# Patient Record
Sex: Female | Born: 1995 | Race: Black or African American | Hispanic: No | Marital: Single | Smoking: Never smoker
Health system: Southern US, Community
[De-identification: ages and names within clinical notes are randomized; demographics above are authoritative.]

## PROBLEM LIST (undated history)

## (undated) DIAGNOSIS — L709 Acne, unspecified: Secondary | ICD-10-CM

## (undated) HISTORY — DX: Acne, unspecified: L70.9

---
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
## 2013-04-03 ENCOUNTER — Other Ambulatory Visit: Payer: Self-pay | Admitting: Gynecology

## 2013-04-23 ENCOUNTER — Ambulatory Visit (INDEPENDENT_AMBULATORY_CARE_PROVIDER_SITE_OTHER): Payer: PRIVATE HEALTH INSURANCE | Admitting: Gynecology

## 2013-04-23 ENCOUNTER — Encounter: Payer: Self-pay | Admitting: Gynecology

## 2013-04-23 DIAGNOSIS — R3915 Urgency of urination: Secondary | ICD-10-CM

## 2013-04-23 DIAGNOSIS — B9689 Other specified bacterial agents as the cause of diseases classified elsewhere: Secondary | ICD-10-CM

## 2013-04-23 DIAGNOSIS — A499 Bacterial infection, unspecified: Secondary | ICD-10-CM

## 2013-04-23 DIAGNOSIS — N760 Acute vaginitis: Secondary | ICD-10-CM

## 2013-04-23 LAB — URINALYSIS W MICROSCOPIC + REFLEX CULTURE
Leukocytes, UA: NEGATIVE
Protein, ur: NEGATIVE mg/dL
RBC / HPF: NONE SEEN RBC/hpf (ref <3)
Urobilinogen, UA: 1 mg/dL (ref 0.0–1.0)

## 2013-04-23 MED ORDER — METRONIDAZOLE 500 MG PO TABS: 500.0000 mg | ORAL_TABLET | Freq: Two times a day (BID) | ORAL | Status: DC

## 2013-04-23 NOTE — Addendum Note (Signed)
Addended by: Richardson Chiquito on: 04/23/2013 05:03 PM   Modules accepted: Orders

## 2013-04-23 NOTE — Progress Notes (Signed)
Patient presents with one to 2 day history of frequency and dysuria. History of recurrent UTIs in the past. Was evaluated by urology by her history this past summer with negative workup. Does seem to follow intercourse. No fever chills nausea vomiting or other constitutional symptoms.  Exam with Sherrilyn Rist Asst. Spine straight without CVA tenderness. Abdomen soft nontender without masses guarding rebound organomegaly. Pelvic external BUS vagina with white discharge. Cervix normal. Uterus normal sized mobile nontender. Adnexa without masses or tenderness.  Assessment and plan: Wet prep is consistent with bacterial vaginosis. Urinalysis without significant white or red cells. Rare bacteria positive nitrate. Will await culture and treat as needed. Will treat the bacterial vaginosis with Flagyl 500 mg twice a day x7 days at her choice. Alcohol avoidance reviewed. May consider post coital antibiotic prophylaxis if culture positive as historically her infection seem to follow intercourse.

## 2013-04-23 NOTE — Patient Instructions (Signed)
Take Flagyl medication twice daily for 7 days. Followup if symptoms persist, worsen or recur.

## 2013-04-24 LAB — WET PREP FOR TRICH, YEAST, CLUE: Yeast Wet Prep HPF POC: NONE SEEN

## 2013-04-24 LAB — URINE CULTURE
Colony Count: NO GROWTH
Organism ID, Bacteria: NO GROWTH

## 2013-04-30 ENCOUNTER — Ambulatory Visit (INDEPENDENT_AMBULATORY_CARE_PROVIDER_SITE_OTHER): Payer: PRIVATE HEALTH INSURANCE | Admitting: Gynecology

## 2013-04-30 ENCOUNTER — Encounter: Payer: Self-pay | Admitting: Gynecology

## 2013-04-30 VITALS — BP 110/62 | Ht 65.0 in | Wt 126.6 lb

## 2013-04-30 DIAGNOSIS — Z309 Encounter for contraceptive management, unspecified: Secondary | ICD-10-CM

## 2013-04-30 DIAGNOSIS — Z01419 Encounter for gynecological examination (general) (routine) without abnormal findings: Secondary | ICD-10-CM

## 2013-04-30 LAB — CBC WITH DIFFERENTIAL/PLATELET
Basophils Absolute: 0 10*3/uL (ref 0.0–0.1)
Basophils Relative: 1 % (ref 0–1)
Eosinophils Absolute: 0.2 10*3/uL (ref 0.0–0.7)
HCT: 39.7 % (ref 36.0–46.0)
MCH: 28.9 pg (ref 26.0–34.0)
MCHC: 33.2 g/dL (ref 30.0–36.0)
Monocytes Absolute: 0.4 10*3/uL (ref 0.1–1.0)
Neutro Abs: 1 10*3/uL — ABNORMAL LOW (ref 1.7–7.7)
RDW: 14.6 % (ref 11.5–15.5)

## 2013-04-30 MED ORDER — METRONIDAZOLE 500 MG PO TABS: 500.0000 mg | ORAL_TABLET | Freq: Two times a day (BID) | ORAL | Status: DC

## 2013-04-30 MED ORDER — NORGESTIMATE-ETH ESTRADIOL 0.25-35 MG-MCG PO TABS: ORAL_TABLET | ORAL | Status: DC

## 2013-04-30 MED ORDER — ADAPALENE-BENZOYL PEROXIDE 0.1-2.5 % EX GEL: 1.0000 "application " | Freq: Every day | CUTANEOUS | Status: DC

## 2013-04-30 NOTE — Patient Instructions (Signed)
Follow up in one year, sooner as needed. 

## 2013-04-30 NOTE — Progress Notes (Addendum)
Cassandra Cannon [birth year] [id number]        17 y.o.  G0P0 for annual exam.  Several issues noted below.  Past medical history,surgical history, problem list, medications, allergies, family history and social history were all reviewed and documented in the EPIC chart.  ROS:  Performed and pertinent positives and negatives are included in the history, assessment and plan .  Exam: Sherrilyn Rist assistant Filed Vitals:   04/30/13 1001  BP: 110/62  Height: 5\' 5"  (1.651 m)  Weight: 126 lb 9.6 oz (57.425 kg)   General appearance  Normal Skin grossly normal Head/Neck normal with no cervical or supraclavicular adenopathy thyroid normal Lungs  clear Cardiac RR, without RMG Abdominal  soft, nontender, without masses, organomegaly or hernia Breasts  examined lying and sitting without masses, retractions, discharge or axillary adenopathy. Pelvic  Ext/BUS/vagina  Normal with light menses flow  Cervix  Normal with light menses flow  Uterus  anteverted, normal size, shape and contour, midline and mobile nontender   Adnexa  Without masses or tenderness    Anus and perineum  Normal      Assessment/Plan:  17 y.o. G0P0 female for annual exam.   1. Contraceptive management. Patient on Sprintec doing well and wants to continue. I refilled her x1 year. [place] is [place] and I wrote for 4 packs at a time. 2. Pap smear. Will initiate at age 58. 3. STD screening offered and declined. Need to use condoms despite BCPs to help decrease STD risk and backup contraception reviewed. 4. Breast health. SBE monthly reviewed. 5. Gardasil series received. 6. Health maintenance. Check baseline CBC and urinalysis. She asked for a prophylactic prescription of Flagyl in case she develops BV while in [place]. Flagyl 500 mg twice a day x7 days prescription provided. Also asked if I could refill her adapalene benzoyl peroxide facial gel. Disclaimer that I do not routinely prescribe this was discussed and she understands and  accepts this and I refilled her for this also. Followup one year, sooner as needed.   Note: This document was prepared with digital dictation and possible smart phrase technology. Any transcriptional errors that result from this process are unintentional.   Dara Lords MD, 10:29 AM 04/30/2013

## 2013-05-01 LAB — URINALYSIS W MICROSCOPIC + REFLEX CULTURE
Bacteria, UA: NONE SEEN
Bilirubin Urine: NEGATIVE
Glucose, UA: NEGATIVE mg/dL
Hgb urine dipstick: NEGATIVE
Protein, ur: NEGATIVE mg/dL
Urobilinogen, UA: 0.2 mg/dL (ref 0.0–1.0)

## 2013-06-01 ENCOUNTER — Other Ambulatory Visit: Payer: Self-pay | Admitting: Gynecology

## 2013-06-01 DIAGNOSIS — D72819 Decreased white blood cell count, unspecified: Secondary | ICD-10-CM

## 2013-12-04 ENCOUNTER — Telehealth: Payer: Self-pay | Admitting: *Deleted

## 2013-12-04 NOTE — Telephone Encounter (Signed)
Pt called requesting refill on Birth control pills, I left message on pt voicemail which pharmacy she would like Rx sent to.

## 2013-12-07 MED ORDER — DROSPIRENONE-ETHINYL ESTRADIOL 3-0.03 MG PO TABS: 1.0000 | ORAL_TABLET | Freq: Every day | ORAL | Status: DC

## 2013-12-07 NOTE — Telephone Encounter (Signed)
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 Pt was able to get BCP Yasmin so pt has been taking them since annual last year. Okay to refill yasmin?

## 2013-12-07 NOTE — Telephone Encounter (Signed)
Cassandra Cannon okay through December 2015. Reminded her that though that Cassandra Cannon has a slightly higher blood clot risk than regular birth control pills. I think in a young patient otherwise healthy this is okay but just wanted her to know.

## 2013-12-07 NOTE — Telephone Encounter (Signed)
Rx sent, left detailed message on pt voicemail with the below.

## 2014-03-15 IMAGING — RF DG VCUG
15 of 16 series · 15 of 16 positions shown · non-contrast
Comparison: Images from office renal ultrasound 11/15/2012.

CLINICAL DATA: Frequent urinary tract infections.

VOIDING CYSTOURETHROGRAM:
Fluoroscopy Time: 26 seconds

[Series 1: run · 1 of 1 slices shown (1 of 14)]
[im 1/1]
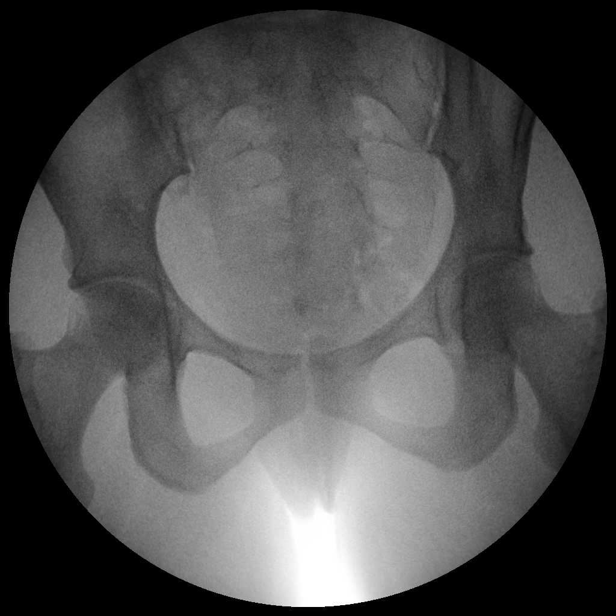

[Series 2: run · 1 of 1 slices shown (2 of 14)]
[im 1/1]
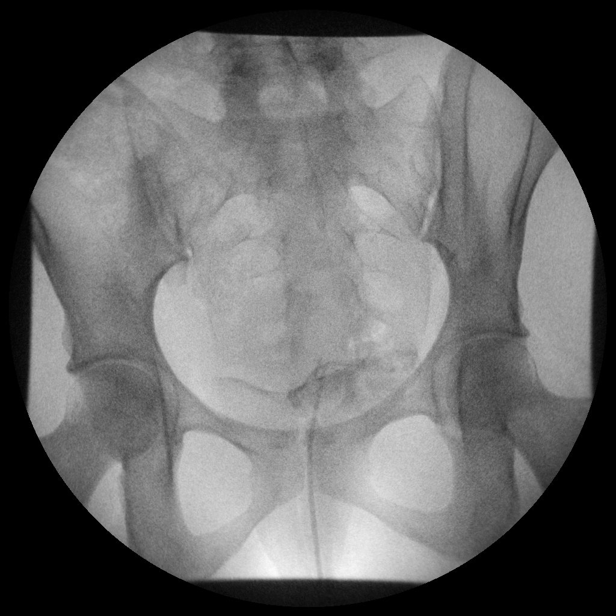

[Series 3: run · 1 of 1 slices shown (3 of 14)]
[im 1/1]
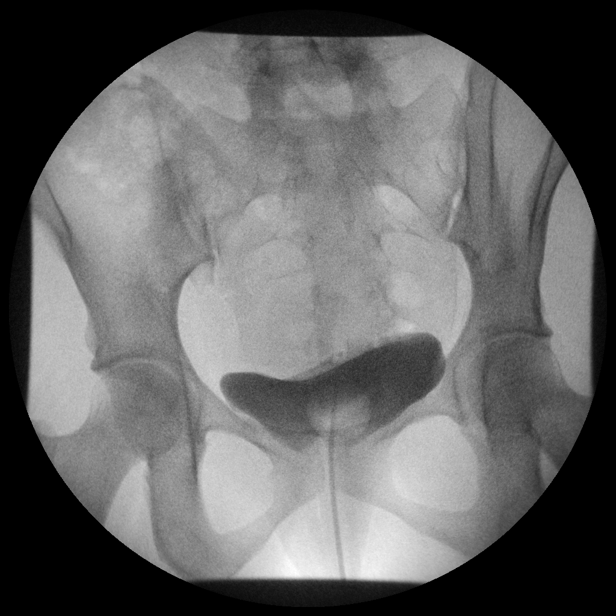

[Series 4: run · 1 of 1 slices shown (4 of 14)]
[im 1/1]
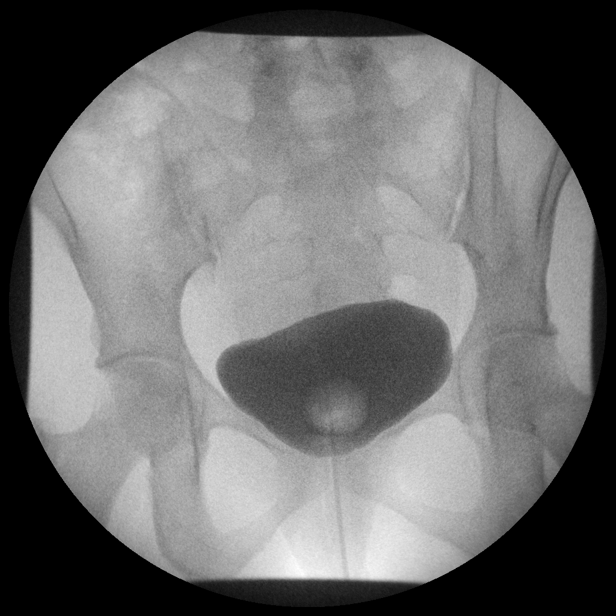

[Series 5: run · 1 of 1 slices shown (5 of 14)]
[im 1/1]
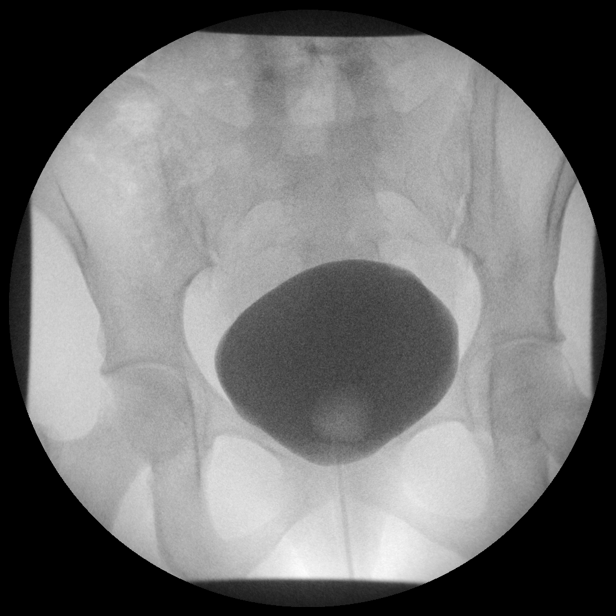

[Series 6: run · 1 of 1 slices shown (6 of 14)]
[im 1/1]
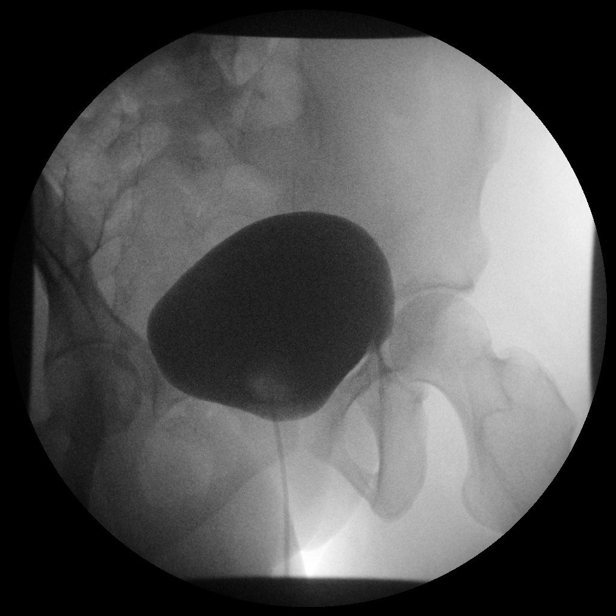

[Series 7: run · 1 of 1 slices shown (7 of 14)]
[im 1/1]
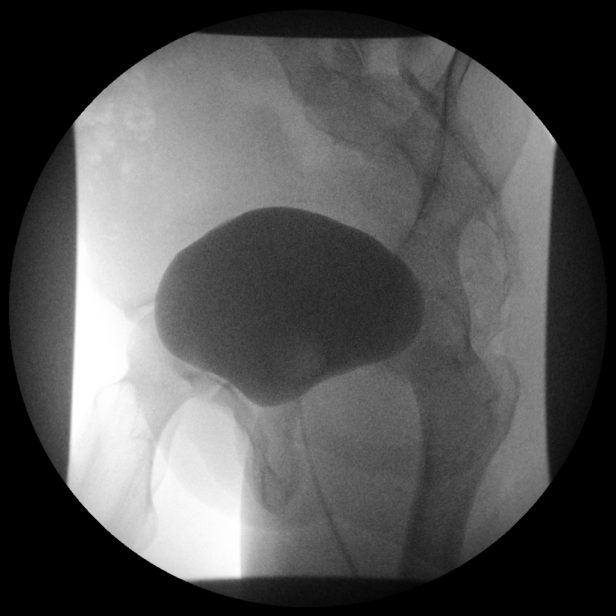

[Series 9: run · 1 of 1 slices shown (8 of 14)]
[im 1/1]
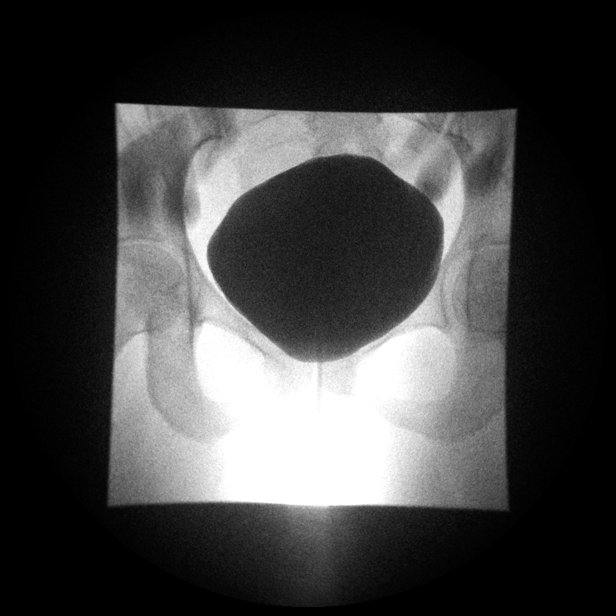

[Series 10: run · 1 of 1 slices shown (9 of 14)]
[im 1/1]
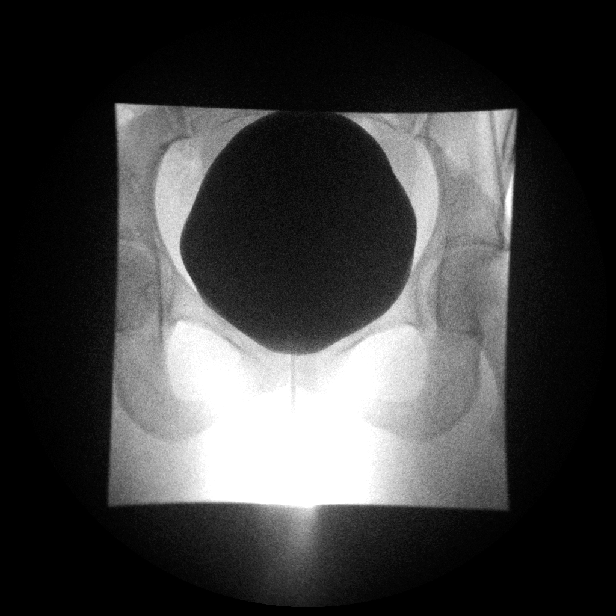

[Series 11: run · 1 of 1 slices shown (10 of 14)]
[im 1/1]
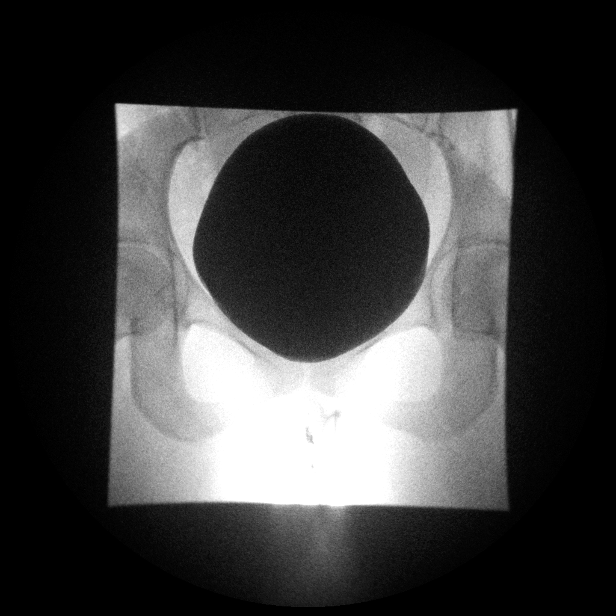

[Series 12: run · 1 of 1 slices shown (11 of 14)]
[im 1/1]
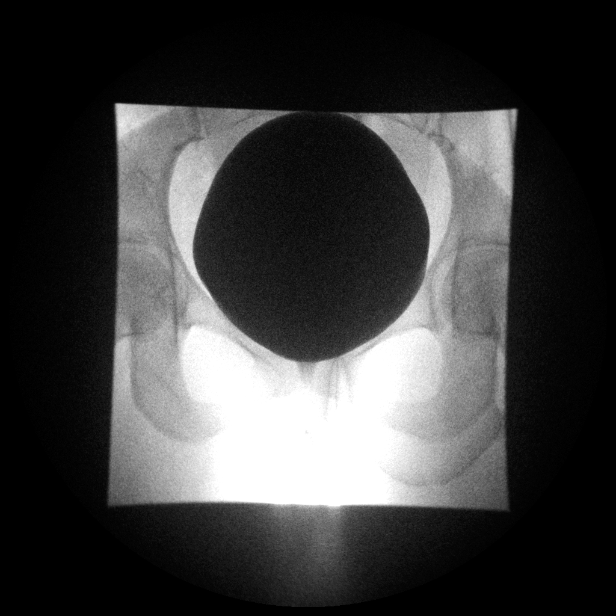

[Series 13: run · 1 of 1 slices shown (12 of 14)]
[im 1/1]
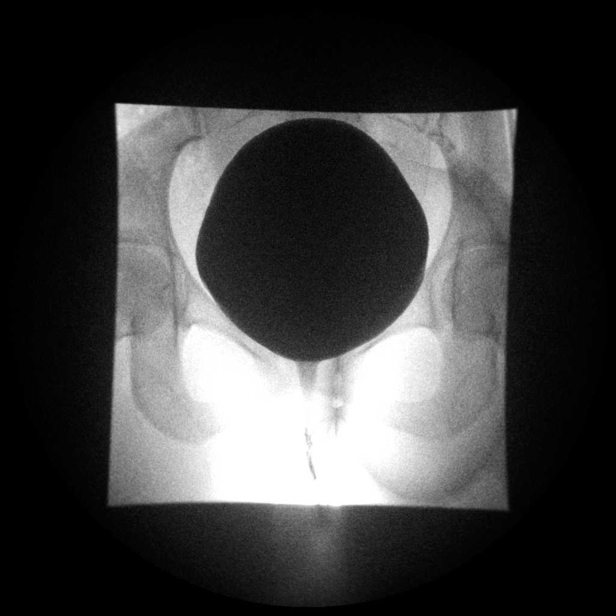

[Series 14: run · 1 of 1 slices shown (13 of 14)]
[im 1/1]
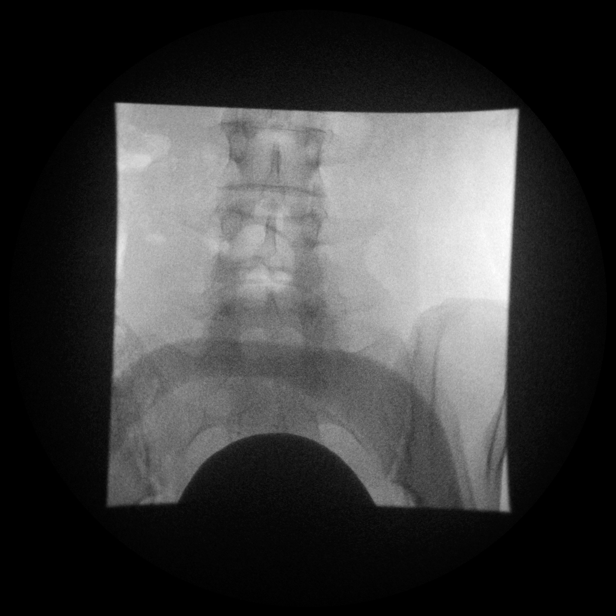

[Series 15: run · 1 of 1 slices shown (14 of 14)]
[im 1/1]
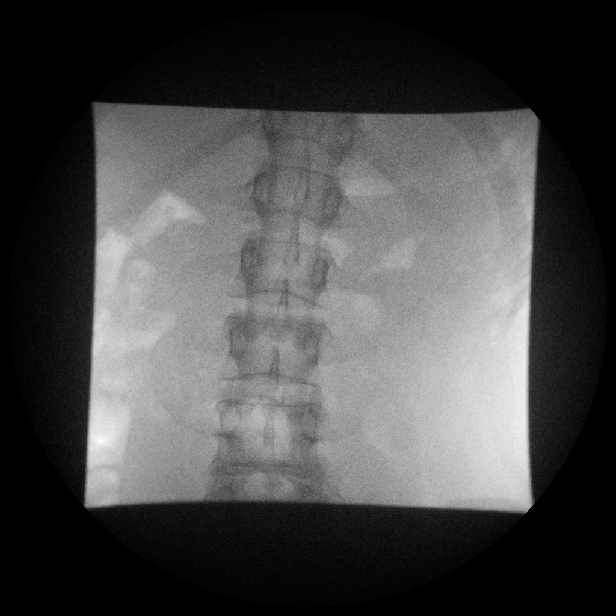

[Series 1001: view not recorded · 0.20mm/px · 1 of 1 slices shown]
[im 1/1]
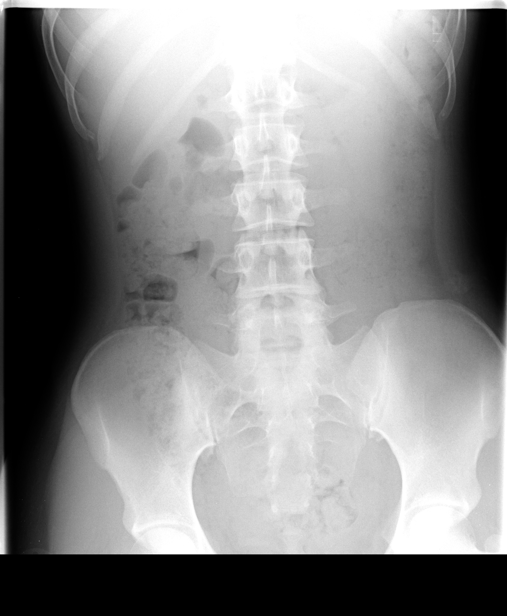

[15 of 16 positions shown; findings below may reference images not displayed]

FINDINGS: The scout abdominal radiograph demonstrates no urinary
tract calculi or osseous abnormalities.  Cystografin was instilled
via a Foley catheter under intermittent fluoroscopy.  The bladder
is smooth-walled without filling defects.  No vesicoureteral reflux
was identified.  The patient was able to void on the fluoroscopy
table, although bladder emptying could not be achieved.  The
visualized urethra appears normal.
IMPRESSION: Normal voiding cystourethrogram.  No evidence of vesicoureteral
reflux.

## 2014-05-06 ENCOUNTER — Ambulatory Visit (INDEPENDENT_AMBULATORY_CARE_PROVIDER_SITE_OTHER): Payer: PRIVATE HEALTH INSURANCE | Admitting: Gynecology

## 2014-05-06 ENCOUNTER — Encounter: Payer: Self-pay | Admitting: Gynecology

## 2014-05-06 VITALS — BP 128/76 | Ht 66.0 in | Wt 131.0 lb

## 2014-05-06 DIAGNOSIS — Z113 Encounter for screening for infections with a predominantly sexual mode of transmission: Secondary | ICD-10-CM

## 2014-05-06 DIAGNOSIS — Z01419 Encounter for gynecological examination (general) (routine) without abnormal findings: Secondary | ICD-10-CM

## 2014-05-06 LAB — CBC WITH DIFFERENTIAL/PLATELET
Basophils Absolute: 0 10*3/uL (ref 0.0–0.1)
Basophils Relative: 1 % (ref 0–1)
Eosinophils Absolute: 0.2 10*3/uL (ref 0.0–0.7)
Eosinophils Relative: 6 % — ABNORMAL HIGH (ref 0–5)
HEMATOCRIT: 38.2 % (ref 36.0–46.0)
Hemoglobin: 12.9 g/dL (ref 12.0–15.0)
Lymphocytes Relative: 50 % — ABNORMAL HIGH (ref 12–46)
Lymphs Abs: 1.8 10*3/uL (ref 0.7–4.0)
MCH: 28.9 pg (ref 26.0–34.0)
MCHC: 33.8 g/dL (ref 30.0–36.0)
MCV: 85.7 fL (ref 78.0–100.0)
MPV: 12.1 fL (ref 9.4–12.4)
Monocytes Absolute: 0.5 10*3/uL (ref 0.1–1.0)
Monocytes Relative: 13 % — ABNORMAL HIGH (ref 3–12)
Neutro Abs: 1.1 10*3/uL — ABNORMAL LOW (ref 1.7–7.7)
Neutrophils Relative %: 30 % — ABNORMAL LOW (ref 43–77)
Platelets: 191 10*3/uL (ref 150–400)
RBC: 4.46 MIL/uL (ref 3.87–5.11)
RDW: 14.1 % (ref 11.5–15.5)
WBC: 3.5 10*3/uL — ABNORMAL LOW (ref 4.0–10.5)

## 2014-05-06 MED ORDER — DROSPIRENONE-ETHINYL ESTRADIOL 3-0.03 MG PO TABS: 1.0000 | ORAL_TABLET | Freq: Every day | ORAL | Status: DC

## 2014-05-06 NOTE — Progress Notes (Signed)
Jones BroomKyayla P Schoonmaker [birth year] [id number]        18 y.o.  G0P0 for annual exam.  Doing well without complaints.  Past medical history,surgical history, problem list, medications, allergies, family history and social history were all reviewed and documented as reviewed in the EPIC chart.  ROS:  Performed with pertinent positives and negatives included in the history, assessment and plan.   Additional significant findings :  none   Exam: Charity fundraiserBlanca assistant Filed Vitals:   05/06/14 1003  BP: 128/76  Height: 5\' 6"  (1.676 m)  Weight: 131 lb (59.421 kg)   General appearance:  Normal affect, orientation and appearance. Skin: Grossly normal HEENT: Without gross lesions.  No cervical or supraclavicular adenopathy. Thyroid normal.  Lungs:  Clear without wheezing, rales or rhonchi Cardiac: RR, without RMG Abdominal:  Soft, nontender, without masses, guarding, rebound, organomegaly or hernia Breasts:  Examined lying and sitting without masses, retractions, discharge or axillary adenopathy. Pelvic:  Ext/BUS/vagina normal  Cervix normal. GC/Chlamydia  Uterus anteverted, normal size, shape and contour, midline and mobile nontender   Adnexa  Without masses or tenderness    Anus and perineum  Normal     Assessment/Plan:  18 y.o. G0P0 female for annual exam with regular menses, Yazmin oral contraceptives.   1. Yazmin BCPs. Doing well and wants to continue.  Slight increased risk of thrombosis reviewed. Refill 1 year provided. 2. STD screening requested. No known exposure just wants to be screened. GC/Chlamydia, HIV, RPR, hepatitis B, hepatitis C ordered. Continue to use condoms regardless of BCPs not only for back up but to help decrease STD risk stressed. 3. Breast health. SBE monthly reviewed. 4. Gardasil series reportedly received. 5. Pap smear initiation next year at age 20 per current screening guidelines. 6. Health maintenance. Baseline CBC urinalysis ordered with above STD screening blood  work. Follow up in one year, sooner as needed.     Dara LordsFONTAINE,Sherlene Rickel P MD, 10:34 AM 05/06/2014

## 2014-05-06 NOTE — Patient Instructions (Signed)
You may obtain a copy of any labs that were done today by logging onto MyChart as outlined in the instructions provided with your AVS (after visit summary). The office will not call with normal lab results but certainly if there are any significant abnormalities then we will contact you.   Health Maintenance, Female A healthy lifestyle and preventative care can promote health and wellness.  Maintain regular health, dental, and eye exams.  Eat a healthy diet. Foods like vegetables, fruits, whole grains, low-fat dairy products, and lean protein foods contain the nutrients you need without too many calories. Decrease your intake of foods high in solid fats, added sugars, and salt. Get information about a proper diet from your caregiver, if necessary.  Regular physical exercise is one of the most important things you can do for your health. Most adults should get at least 150 minutes of moderate-intensity exercise (any activity that increases your heart rate and causes you to sweat) each week. In addition, most adults need muscle-strengthening exercises on 2 or more days a week.   Maintain a healthy weight. The body mass index (BMI) is a screening tool to identify possible weight problems. It provides an estimate of body fat based on height and weight. Your caregiver can help determine your BMI, and can help you achieve or maintain a healthy weight. For adults 20 years and older:  A BMI below 18.5 is considered underweight.  A BMI of 18.5 to 24.9 is normal.  A BMI of 25 to 29.9 is considered overweight.  A BMI of 30 and above is considered obese.  Maintain normal blood lipids and cholesterol by exercising and minimizing your intake of saturated fat. Eat a balanced diet with plenty of fruits and vegetables. Blood tests for lipids and cholesterol should begin at age 61 and be repeated every 5 years. If your lipid or cholesterol levels are high, you are over 50, or you are a high risk for heart  disease, you may need your cholesterol levels checked more frequently.Ongoing high lipid and cholesterol levels should be treated with medicines if diet and exercise are not effective.  If you smoke, find out from your caregiver how to quit. If you do not use tobacco, do not start.  Lung cancer screening is recommended for adults aged 33 80 years who are at high risk for developing lung cancer because of a history of smoking. Yearly low-dose computed tomography (CT) is recommended for people who have at least a 30-pack-year history of smoking and are a current smoker or have quit within the past 15 years. A pack year of smoking is smoking an average of 1 pack of cigarettes a day for 1 year (for example: 1 pack a day for 30 years or 2 packs a day for 15 years). Yearly screening should continue until the smoker has stopped smoking for at least 15 years. Yearly screening should also be stopped for people who develop a health problem that would prevent them from having lung cancer treatment.  If you are pregnant, do not drink alcohol. If you are breastfeeding, be very cautious about drinking alcohol. If you are not pregnant and choose to drink alcohol, do not exceed 1 drink per day. One drink is considered to be 12 ounces (355 mL) of beer, 5 ounces (148 mL) of wine, or 1.5 ounces (44 mL) of liquor.  Avoid use of street drugs. Do not share needles with anyone. Ask for help if you need support or instructions about stopping  the use of drugs.  High blood pressure causes heart disease and increases the risk of stroke. Blood pressure should be checked at least every 1 to 2 years. Ongoing high blood pressure should be treated with medicines, if weight loss and exercise are not effective.  If you are 59 to 20 years old, ask your caregiver if you should take aspirin to prevent strokes.  Diabetes screening involves taking a blood sample to check your fasting blood sugar level. This should be done once every 3  years, after age 91, if you are within normal weight and without risk factors for diabetes. Testing should be considered at a younger age or be carried out more frequently if you are overweight and have at least 1 risk factor for diabetes.  Breast cancer screening is essential preventative care for women. You should practice "breast self-awareness." This means understanding the normal appearance and feel of your breasts and may include breast self-examination. Any changes detected, no matter how small, should be reported to a caregiver. Women in their 66s and 30s should have a clinical breast exam (CBE) by a caregiver as part of a regular health exam every 1 to 3 years. After age 101, women should have a CBE every year. Starting at age 100, women should consider having a mammogram (breast X-ray) every year. Women who have a family history of breast cancer should talk to their caregiver about genetic screening. Women at a high risk of breast cancer should talk to their caregiver about having an MRI and a mammogram every year.  Breast cancer gene (BRCA)-related cancer risk assessment is recommended for women who have family members with BRCA-related cancers. BRCA-related cancers include breast, ovarian, tubal, and peritoneal cancers. Having family members with these cancers may be associated with an increased risk for harmful changes (mutations) in the breast cancer genes BRCA1 and BRCA2. Results of the assessment will determine the need for genetic counseling and BRCA1 and BRCA2 testing.  The Pap test is a screening test for cervical cancer. Women should have a Pap test starting at age 57. Between ages 25 and 35, Pap tests should be repeated every 2 years. Beginning at age 37, you should have a Pap test every 3 years as long as the past 3 Pap tests have been normal. If you had a hysterectomy for a problem that was not cancer or a condition that could lead to cancer, then you no longer need Pap tests. If you are  between ages 50 and 76, and you have had normal Pap tests going back 10 years, you no longer need Pap tests. If you have had past treatment for cervical cancer or a condition that could lead to cancer, you need Pap tests and screening for cancer for at least 20 years after your treatment. If Pap tests have been discontinued, risk factors (such as a new sexual partner) need to be reassessed to determine if screening should be resumed. Some women have medical problems that increase the chance of getting cervical cancer. In these cases, your caregiver may recommend more frequent screening and Pap tests.  The human papillomavirus (HPV) test is an additional test that may be used for cervical cancer screening. The HPV test looks for the virus that can cause the cell changes on the cervix. The cells collected during the Pap test can be tested for HPV. The HPV test could be used to screen women aged 44 years and older, and should be used in women of any age  who have unclear Pap test results. After the age of 55, women should have HPV testing at the same frequency as a Pap test.  Colorectal cancer can be detected and often prevented. Most routine colorectal cancer screening begins at the age of 44 and continues through age 20. However, your caregiver may recommend screening at an earlier age if you have risk factors for colon cancer. On a yearly basis, your caregiver may provide home test kits to check for hidden blood in the stool. Use of a small camera at the end of a tube, to directly examine the colon (sigmoidoscopy or colonoscopy), can detect the earliest forms of colorectal cancer. Talk to your caregiver about this at age 86, when routine screening begins. Direct examination of the colon should be repeated every 5 to 10 years through age 13, unless early forms of pre-cancerous polyps or small growths are found.  Hepatitis C blood testing is recommended for all people born from [birth year] through 1967 and any  individual with known risks for hepatitis C.  Practice safe sex. Use condoms and avoid high-risk sexual practices to reduce the spread of sexually transmitted infections (STIs). Sexually active women aged 36 and younger should be checked for Chlamydia, which is a common sexually transmitted infection. Older women with new or multiple partners should also be tested for Chlamydia. Testing for other STIs is recommended if you are sexually active and at increased risk.  Osteoporosis is a disease in which the bones lose minerals and strength with aging. This can result in serious bone fractures. The risk of osteoporosis can be identified using a bone density scan. Women ages 20 and over and women at risk for fractures or osteoporosis should discuss screening with their caregivers. Ask your caregiver whether you should be taking a calcium supplement or vitamin D to reduce the rate of osteoporosis.  Menopause can be associated with physical symptoms and risks. Hormone replacement therapy is available to decrease symptoms and risks. You should talk to your caregiver about whether hormone replacement therapy is right for you.  Use sunscreen. Apply sunscreen liberally and repeatedly throughout the day. You should seek shade when your shadow is shorter than you. Protect yourself by wearing long sleeves, pants, a wide-brimmed hat, and sunglasses year round, whenever you are outdoors.  Notify your caregiver of new moles or changes in moles, especially if there is a change in shape or color. Also notify your caregiver if a mole is larger than the size of a pencil eraser.  Stay current with your immunizations. Document Released: 11/09/2010 Document Revised: 08/21/2012 Document Reviewed: 11/09/2010 [facility] Patient Information 2014 [employer].

## 2014-05-07 LAB — URINALYSIS W MICROSCOPIC + REFLEX CULTURE
BACTERIA UA: NONE SEEN
BILIRUBIN URINE: NEGATIVE
CRYSTALS: NONE SEEN
Casts: NONE SEEN
GLUCOSE, UA: NEGATIVE mg/dL
Hgb urine dipstick: NEGATIVE
KETONES UR: NEGATIVE mg/dL
Leukocytes, UA: NEGATIVE
Nitrite: NEGATIVE
PROTEIN: NEGATIVE mg/dL
Specific Gravity, Urine: 1.022 (ref 1.005–1.030)
Squamous Epithelial / LPF: NONE SEEN
UROBILINOGEN UA: 0.2 mg/dL (ref 0.0–1.0)
pH: 5.5 (ref 5.0–8.0)

## 2014-05-07 LAB — RPR

## 2014-05-07 LAB — GC/CHLAMYDIA PROBE AMP
CT Probe RNA: NEGATIVE
GC Probe RNA: NEGATIVE

## 2014-05-07 LAB — HIV ANTIBODY (ROUTINE TESTING W REFLEX): HIV 1&2 Ab, 4th Generation: NONREACTIVE

## 2014-05-07 LAB — HEPATITIS B SURFACE ANTIGEN: HEP B S AG: NEGATIVE

## 2014-05-07 LAB — HEPATITIS C ANTIBODY: HCV AB: NEGATIVE

## 2015-03-12 ENCOUNTER — Other Ambulatory Visit: Payer: Self-pay

## 2015-03-12 MED ORDER — TRIAMCINOLONE ACETONIDE 0.1 % EX OINT: TOPICAL_OINTMENT | CUTANEOUS | Status: AC

## 2015-09-25 ENCOUNTER — Encounter: Payer: Self-pay | Admitting: Gynecology

## 2015-09-25 ENCOUNTER — Ambulatory Visit (INDEPENDENT_AMBULATORY_CARE_PROVIDER_SITE_OTHER): Payer: BC Managed Care – PPO | Admitting: Gynecology

## 2015-09-25 VITALS — BP 120/70 | Ht 66.0 in | Wt 139.0 lb

## 2015-09-25 DIAGNOSIS — Z01419 Encounter for gynecological examination (general) (routine) without abnormal findings: Secondary | ICD-10-CM

## 2015-09-25 MED ORDER — DROSPIRENONE-ETHINYL ESTRADIOL 3-0.03 MG PO TABS: 1.0000 | ORAL_TABLET | Freq: Every day | ORAL | Status: DC

## 2015-09-25 NOTE — Patient Instructions (Signed)

## 2015-09-25 NOTE — Progress Notes (Signed)
    Jones BroomKyayla P Mura 1995-11-21 [id number]        20 y.o.  G0P0  for annual exam.  Doing well without complaints  Past medical history,surgical history, problem list, medications, allergies, family history and social history were all reviewed and documented as reviewed in the EPIC chart.  ROS:  Performed with pertinent positives and negatives included in the history, assessment and plan.   Additional significant findings :  none   Exam: Kennon PortelaKim Gardner assistant Filed Vitals:   09/25/15 1403  BP: 120/70  Height: 5\' 6"  (1.676 m)  Weight: 139 lb (63.05 kg)   General appearance:  Normal affect, orientation and appearance. Skin: Grossly normal HEENT: Without gross lesions.  No cervical or supraclavicular adenopathy. Thyroid normal.  Lungs:  Clear without wheezing, rales or rhonchi Cardiac: RR, without RMG Abdominal:  Soft, nontender, without masses, guarding, rebound, organomegaly or hernia Breasts:  Examined lying and sitting without masses, retractions, discharge or axillary adenopathy. Pelvic:  Ext/BUS/vagina normal  Cervix normal, Pap smear done  Uterus anteverted, normal size, shape and contour, midline and mobile nontender   Adnexa without masses or tenderness    Anus and perineum normal    Assessment/Plan:  [age] y.o. G0P0 female for annual exam with regular menses, oral contraceptives.   1. Yaz BCPs. Doing well and wants to continue. Have discussed the risks of thrombosis previously and she understands and accepts. Refill 1 year provided. 2. STD screening offered. Patient relates having done this through her school clinic and declines. 3. Breast health. SBE monthly reviewed. 4. First Pap smear done today as she has turned 19. Plan repeat Pap smear at 3 year interval if normal per current screening guidelines. 5. Gardasil series reportedly received. 6. Health maintenance. No routine lab work done as patient relates this done at her school health clinic. Follow up 1 year, sooner  as needed.   Dara LordsFONTAINE,Adrienna Karis P MD, 2:26 PM 09/25/2015

## 2015-09-25 NOTE — Addendum Note (Signed)
Addended by: Dayna BarkerGARDNER, Bailei Buist K on: 09/25/2015 02:33 PM   Modules accepted: Orders, SmartSet

## 2015-09-29 LAB — PAP IG W/ RFLX HPV ASCU

## 2016-03-30 ENCOUNTER — Ambulatory Visit (INDEPENDENT_AMBULATORY_CARE_PROVIDER_SITE_OTHER): Payer: BC Managed Care – PPO | Admitting: Women's Health

## 2016-03-30 ENCOUNTER — Telehealth: Payer: Self-pay | Admitting: *Deleted

## 2016-03-30 ENCOUNTER — Encounter: Payer: Self-pay | Admitting: Women's Health

## 2016-03-30 VITALS — BP 118/80 | Ht 66.0 in | Wt 139.0 lb

## 2016-03-30 DIAGNOSIS — Z113 Encounter for screening for infections with a predominantly sexual mode of transmission: Secondary | ICD-10-CM

## 2016-03-30 LAB — HIV ANTIBODY (ROUTINE TESTING W REFLEX): HIV 1&2 Ab, 4th Generation: NONREACTIVE

## 2016-03-30 MED ORDER — DROSPIRENONE-ETHINYL ESTRADIOL 3-0.03 MG PO TABS: 1.0000 | ORAL_TABLET | Freq: Every day | ORAL | 4 refills | Status: DC

## 2016-03-30 NOTE — Patient Instructions (Signed)
Oral Contraception Information Oral contraceptive pills (OCPs) are medicines taken to prevent pregnancy. OCPs work by preventing the ovaries from releasing eggs. The hormones in OCPs also cause the cervical mucus to thicken, preventing the sperm from entering the uterus. The hormones also cause the uterine lining to become thin, not allowing a fertilized egg to attach to the inside of the uterus. OCPs are highly effective when taken exactly as prescribed. However, OCPs do not prevent sexually transmitted diseases (STDs). Safe sex practices, such as using condoms along with the pill, can help prevent STDs.  Before taking the pill, you may have a physical exam and Pap test. Your health care provider may order blood tests. The health care provider will make sure you are a good candidate for oral contraception. Discuss with your health care provider the possible side effects of the OCP you may be prescribed. When starting an OCP, it can take 2 to 3 months for the body to adjust to the changes in hormone levels in your body.  TYPES OF ORAL CONTRACEPTION  The combination pill-This pill contains estrogen and progestin (synthetic progesterone) hormones. The combination pill comes in 21-day, 28-day, or 91-day packs. Some types of combination pills are meant to be taken continuously (365-day pills). With 21-day packs, you do not take pills for 7 days after the last pill. With 28-day packs, the pill is taken every day. The last 7 pills are without hormones. Certain types of pills have more than 21 hormone-containing pills. With 91-day packs, the first 84 pills contain both hormones, and the last 7 pills contain no hormones or contain estrogen only.  The minipill-This pill contains the progesterone hormone only. The pill is taken every day continuously. It is very important to take the pill at the same time each day. The minipill comes in packs of 28 pills. All 28 pills contain the hormone.  ADVANTAGES OF ORAL  CONTRACEPTIVE PILLS  Decreases premenstrual symptoms.   Treats menstrual period cramps.   Regulates the menstrual cycle.   Decreases a heavy menstrual flow.   May treatacne, depending on the type of pill.   Treats abnormal uterine bleeding.   Treats polycystic ovarian syndrome.   Treats endometriosis.   Can be used as emergency contraception.  THINGS THAT CAN MAKE ORAL CONTRACEPTIVE PILLS LESS EFFECTIVE OCPs can be less effective if:   You forget to take the pill at the same time every day.   You have a stomach or intestinal disease that lessens the absorption of the pill.   You take OCPs with other medicines that make OCPs less effective, such as antibiotics, certain HIV medicines, and some seizure medicines.   You take expired OCPs.   You forget to restart the pill on day 7, when using the packs of 21 pills.  RISKS ASSOCIATED WITH ORAL CONTRACEPTIVE PILLS  Oral contraceptive pills can sometimes cause side effects, such as:  Headache.  Nausea.  Breast tenderness.  Irregular bleeding or spotting. Combination pills are also associated with a small increased risk of:  Blood clots.  Heart attack.  Stroke. This information is not intended to replace advice given to you by your health care provider. Make sure you discuss any questions you have with your health care provider. Document Released: 07/17/2002 Document Revised: 08/18/2015 Document Reviewed: 10/15/2012 Elsevier Interactive Patient Education  2017 [employer]  

## 2016-03-30 NOTE — Telephone Encounter (Signed)
Pt Rx for yasmin was not sent to correct pharmacy. [facility] [place]. Rx sent.

## 2016-03-30 NOTE — Progress Notes (Signed)
Presents requesting STD screen had unprotected intercourse with new partner. Also states has had right breast tenderness for the past week, denies palpable nodules or visible nipple discharge. Cycle started today. Denies abdominal pain, urinary symptoms, vaginal discharge or fever. [place]. Has been off Yaz for over a month due to the move, (misplaced rx) requesting refill on Yaz.  Exam: Appears well. Breast exam in sitting and lying position without palpable nodules, dimpling, retractions, slight tenderness at areola. Abdomen soft, nontender, external genitalia within normal limits, speculum exam scant menses noted, no odor or erythema. GC/Chlamydia culture taken. Bimanual no CMT or adnexal tenderness.  Breast tenderness STD screen Contraception management  Plan: GC/Chlamydia culture pending, HIV, hep B, C, RPR. Instructed to start Yaz today, take daily, reviewed slight risk for blood clots and strokes, condoms especially first month and for infection control. Reviewed normality of breast exam, reviewed common to have some tenderness prior to menstrual cycle.

## 2016-03-31 LAB — RPR

## 2016-03-31 LAB — HEPATITIS B SURFACE ANTIGEN: HEP B S AG: NEGATIVE

## 2016-03-31 LAB — HEPATITIS C ANTIBODY: HCV Ab: NEGATIVE

## 2016-03-31 LAB — GC/CHLAMYDIA PROBE AMP
CT Probe RNA: NOT DETECTED
GC PROBE AMP APTIMA: NOT DETECTED

## 2017-05-19 ENCOUNTER — Other Ambulatory Visit: Payer: Self-pay | Admitting: Women's Health

## 2017-05-19 NOTE — Telephone Encounter (Signed)
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 I explained to her she is very overdue for annual last see for annual on 5/17, I asked her how she has been getting her pills it appears nancy started pt on bcp on 03/30/16 and 11 refills were given. I told her I doubt you will refill, but I will check.

## 2017-05-19 NOTE — Telephone Encounter (Signed)
Correct.  She really needs to find a practitioner there for ongoing health care and renew her prescription.  If she is at the point of running out I will refill one pack only to give her time to get in to see somebody but she needs to do that.

## 2017-05-20 MED ORDER — DROSPIRENONE-ETHINYL ESTRADIOL 3-0.03 MG PO TABS: 1.0000 | ORAL_TABLET | Freq: Every day | ORAL | 0 refills | Status: AC

## 2017-05-20 NOTE — Telephone Encounter (Signed)
Pt mailbox full, not able to relay.

## 2017-05-20 NOTE — Telephone Encounter (Signed)
Pt wanted Rx sent to [facility] [place] on [address]. Rx sent.

## 2019-01-29 ENCOUNTER — Encounter: Payer: Self-pay | Admitting: Gynecology
